# Patient Record
Sex: Female | Born: 1935 | Race: White | Hispanic: No | Marital: Married | Smoking: Never smoker
Health system: Southern US, Community
[De-identification: ages and names within clinical notes are randomized; demographics above are authoritative.]

## PROBLEM LIST (undated history)

## (undated) DIAGNOSIS — R001 Bradycardia, unspecified: Secondary | ICD-10-CM

## (undated) HISTORY — PX: NEPHRECTOMY: SHX65

## (undated) HISTORY — PX: PACEMAKER INSERTION: SHX728

## (undated) HISTORY — PX: CARPAL TUNNEL RELEASE: SHX101

---

## 2008-06-06 ENCOUNTER — Encounter: Admission: RE | Admit: 2008-06-06 | Discharge: 2008-06-06 | Payer: Self-pay | Admitting: Neurosurgery

## 2010-02-05 ENCOUNTER — Encounter: Admission: RE | Admit: 2010-02-05 | Discharge: 2010-02-05 | Payer: Self-pay | Admitting: Neurosurgery

## 2010-09-24 ENCOUNTER — Other Ambulatory Visit: Payer: Self-pay | Admitting: Neurosurgery

## 2010-09-24 DIAGNOSIS — M47816 Spondylosis without myelopathy or radiculopathy, lumbar region: Secondary | ICD-10-CM

## 2010-10-02 ENCOUNTER — Other Ambulatory Visit: Payer: Self-pay

## 2010-10-15 ENCOUNTER — Ambulatory Visit
Admission: RE | Admit: 2010-10-15 | Discharge: 2010-10-15 | Disposition: A | Discharge status: Home / Self Care | Payer: Medicare Other | Source: Ambulatory Visit | Attending: Neurosurgery | Admitting: Neurosurgery

## 2010-10-15 DIAGNOSIS — M47816 Spondylosis without myelopathy or radiculopathy, lumbar region: Secondary | ICD-10-CM

## 2010-10-23 ENCOUNTER — Other Ambulatory Visit: Payer: Self-pay | Admitting: Neurosurgery

## 2010-10-23 DIAGNOSIS — M47816 Spondylosis without myelopathy or radiculopathy, lumbar region: Secondary | ICD-10-CM

## 2010-10-26 ENCOUNTER — Ambulatory Visit
Admission: RE | Admit: 2010-10-26 | Discharge: 2010-10-26 | Disposition: A | Discharge status: Home / Self Care | Payer: Medicare Other | Source: Ambulatory Visit | Attending: Neurosurgery | Admitting: Neurosurgery

## 2010-10-26 DIAGNOSIS — M47816 Spondylosis without myelopathy or radiculopathy, lumbar region: Secondary | ICD-10-CM

## 2010-10-26 MED ORDER — IOHEXOL 180 MG/ML  SOLN
1.0000 mL | Freq: Once | INTRAMUSCULAR | Status: AC | PRN
  Administered 2010-10-26: 1 mL via EPIDURAL

## 2010-10-26 MED ORDER — METHYLPREDNISOLONE ACETATE 40 MG/ML INJ SUSP (RADIOLOG
120.0000 mg | Freq: Once | INTRAMUSCULAR | Status: AC
  Administered 2010-10-26: 120 mg via EPIDURAL

## 2010-11-18 ENCOUNTER — Other Ambulatory Visit: Payer: Self-pay | Admitting: Neurosurgery

## 2010-11-18 DIAGNOSIS — M47816 Spondylosis without myelopathy or radiculopathy, lumbar region: Secondary | ICD-10-CM

## 2010-11-19 ENCOUNTER — Other Ambulatory Visit: Payer: Medicare Other

## 2010-11-27 ENCOUNTER — Ambulatory Visit
Admission: RE | Admit: 2010-11-27 | Discharge: 2010-11-27 | Disposition: A | Discharge status: Home / Self Care | Payer: Medicare Other | Source: Ambulatory Visit | Attending: Neurosurgery | Admitting: Neurosurgery

## 2010-11-27 DIAGNOSIS — M47816 Spondylosis without myelopathy or radiculopathy, lumbar region: Secondary | ICD-10-CM

## 2010-11-27 MED ORDER — METHYLPREDNISOLONE ACETATE 40 MG/ML INJ SUSP (RADIOLOG
120.0000 mg | Freq: Once | INTRAMUSCULAR | Status: AC
  Administered 2010-11-27: 120 mg via EPIDURAL

## 2010-11-27 MED ORDER — IOHEXOL 180 MG/ML  SOLN
1.0000 mL | Freq: Once | INTRAMUSCULAR | Status: AC | PRN
  Administered 2010-11-27: 1 mL via EPIDURAL

## 2015-06-29 ENCOUNTER — Encounter (HOSPITAL_BASED_OUTPATIENT_CLINIC_OR_DEPARTMENT_OTHER): Payer: Self-pay | Admitting: *Deleted

## 2015-06-29 ENCOUNTER — Emergency Department (HOSPITAL_BASED_OUTPATIENT_CLINIC_OR_DEPARTMENT_OTHER)
Admission: EM | Admit: 2015-06-29 | Discharge: 2015-06-29 | Disposition: A | Discharge status: Home / Self Care | Payer: Medicare HMO | Attending: Emergency Medicine | Admitting: Emergency Medicine

## 2015-06-29 DIAGNOSIS — Z79899 Other long term (current) drug therapy: Secondary | ICD-10-CM | POA: Insufficient documentation

## 2015-06-29 DIAGNOSIS — R011 Cardiac murmur, unspecified: Secondary | ICD-10-CM | POA: Insufficient documentation

## 2015-06-29 DIAGNOSIS — Z950 Presence of cardiac pacemaker: Secondary | ICD-10-CM | POA: Insufficient documentation

## 2015-06-29 DIAGNOSIS — K529 Noninfective gastroenteritis and colitis, unspecified: Secondary | ICD-10-CM

## 2015-06-29 DIAGNOSIS — R112 Nausea with vomiting, unspecified: Secondary | ICD-10-CM | POA: Diagnosis present

## 2015-06-29 HISTORY — DX: Bradycardia, unspecified: R00.1

## 2015-06-29 LAB — CBC WITH DIFFERENTIAL/PLATELET
BASOS ABS: 0 10*3/uL (ref 0.0–0.1)
BASOS PCT: 0 %
EOS ABS: 0 10*3/uL (ref 0.0–0.7)
EOS PCT: 0 %
HCT: 38.9 % (ref 36.0–46.0)
Hemoglobin: 13.1 g/dL (ref 12.0–15.0)
Lymphocytes Relative: 3 %
Lymphs Abs: 0.3 10*3/uL — ABNORMAL LOW (ref 0.7–4.0)
MCH: 31.4 pg (ref 26.0–34.0)
MCHC: 33.7 g/dL (ref 30.0–36.0)
MCV: 93.3 fL (ref 78.0–100.0)
MONO ABS: 0.4 10*3/uL (ref 0.1–1.0)
MONOS PCT: 3 %
Neutro Abs: 11.9 10*3/uL — ABNORMAL HIGH (ref 1.7–7.7)
Neutrophils Relative %: 94 %
PLATELETS: 178 10*3/uL (ref 150–400)
RBC: 4.17 MIL/uL (ref 3.87–5.11)
RDW: 12.2 % (ref 11.5–15.5)
WBC: 12.6 10*3/uL — ABNORMAL HIGH (ref 4.0–10.5)

## 2015-06-29 LAB — COMPREHENSIVE METABOLIC PANEL
ALBUMIN: 3.7 g/dL (ref 3.5–5.0)
ALT: 19 U/L (ref 14–54)
AST: 19 U/L (ref 15–41)
Alkaline Phosphatase: 52 U/L (ref 38–126)
Anion gap: 8 (ref 5–15)
BUN: 23 mg/dL — AB (ref 6–20)
CALCIUM: 9.1 mg/dL (ref 8.9–10.3)
CHLORIDE: 102 mmol/L (ref 101–111)
CO2: 24 mmol/L (ref 22–32)
Creatinine, Ser: 0.65 mg/dL (ref 0.44–1.00)
GFR calc Af Amer: >60 mL/min (ref >60)
GLUCOSE: 108 mg/dL — AB (ref 65–99)
Potassium: 4.4 mmol/L (ref 3.5–5.1)
SODIUM: 134 mmol/L — AB (ref 135–145)
TOTAL PROTEIN: 7 g/dL (ref 6.5–8.1)
Total Bilirubin: 0.8 mg/dL (ref 0.3–1.2)

## 2015-06-29 LAB — TROPONIN I

## 2015-06-29 LAB — URINALYSIS, ROUTINE W REFLEX MICROSCOPIC
Glucose, UA: NEGATIVE mg/dL
KETONES UR: 15 mg/dL — AB
Nitrite: NEGATIVE
PROTEIN: NEGATIVE mg/dL
SPECIFIC GRAVITY, URINE: 1.025 (ref 1.005–1.030)
pH: 5.5 (ref 5.0–8.0)

## 2015-06-29 LAB — LIPASE, BLOOD: LIPASE: 23 U/L (ref 11–51)

## 2015-06-29 LAB — URINE MICROSCOPIC-ADD ON

## 2015-06-29 MED ORDER — SODIUM CHLORIDE 0.9 % IV BOLUS (SEPSIS)
500.0000 mL | Freq: Once | INTRAVENOUS | Status: AC
  Administered 2015-06-29: 500 mL via INTRAVENOUS

## 2015-06-29 MED ORDER — FAMOTIDINE IN NACL 20-0.9 MG/50ML-% IV SOLN
20.0000 mg | Freq: Once | INTRAVENOUS | Status: AC
  Administered 2015-06-29: 20 mg via INTRAVENOUS
  Filled 2015-06-29: qty 50

## 2015-06-29 MED ORDER — FAMOTIDINE 20 MG PO TABS: 20.0000 mg | ORAL_TABLET | Freq: Two times a day (BID) | ORAL | Status: AC

## 2015-06-29 MED ORDER — SODIUM CHLORIDE 0.9 % IV SOLN
INTRAVENOUS | Status: DC
  Administered 2015-06-29: 18:00:00 via INTRAVENOUS

## 2015-06-29 MED ORDER — ONDANSETRON 4 MG PO TBDP: 4.0000 mg | ORAL_TABLET | ORAL | Status: AC | PRN

## 2015-06-29 MED ORDER — ONDANSETRON HCL 4 MG/2ML IJ SOLN
4.0000 mg | Freq: Once | INTRAMUSCULAR | Status: AC
  Administered 2015-06-29: 4 mg via INTRAVENOUS
  Filled 2015-06-29: qty 2

## 2015-06-29 NOTE — ED Provider Notes (Signed)
CSN: [id number]     Arrival date & time 06/29/15  1511 History  By signing my name below, I, [facility] Washington, attest that this documentation has been prepared under the direction and in the presence of Arby BarretteMarcy Quinlin Conant, MD. Electronically Signed: Randell PatientMarrissa Washington, ED Scribe. 06/29/2015. 7:39 PM.   Chief Complaint  Patient presents with  . Emesis   The history is provided by the patient. No language interpreter was used.   HPI Comments: Darnelle SpangleLeah S Fisch is a 82 y.o. female who presents to the Emergency Department complaining of intermittent, moderate emesis onset last night. Patient reports she was asymptomatic upon going to bed but woke from sleep 14 hours ago with nausea, followed by frequent emesis and diarrhea x4-5. She notes abdominal discomfort that she describes as feeling unsettled without pain. She has been eating less yesterday and today. She has not taken any medications for treatment . Per husband, she has been taking Tramadol for pain management after a right carpal tunnel release but has been eating less food with this medication. She reports that she ate barbecue last night with her husband who is asymptomatic. Denies abdominal pain, SOB, CP, difficulty urinating, dysuria.  Past Medical History  Diagnosis Date  . Bradycardia    Past Surgical History  Procedure Laterality Date  . Pacemaker insertion    . Carpal tunnel release Right    History reviewed. No pertinent family history. Social History  Substance Use Topics  . Smoking status: Never Smoker   . Smokeless tobacco: None  . Alcohol Use: No   OB History    No data available     Review of Systems A complete 10 system review of systems was obtained and all systems are negative except as noted in the HPI and PMH.   Allergies  Codeine and Tetracyclines & related  Home Medications   Prior to Admission medications   Medication Sig Start Date End Date Taking? Authorizing Provider  ALPRAZolam Prudy Feeler(XANAX) 0.25 MG tablet  Take 0.25 mg by mouth at bedtime as needed for anxiety.   Yes Historical Provider, MD  carvedilol (COREG) 3.125 MG tablet Take 3.125 mg by mouth 2 (two) times daily with a meal.   Yes Historical Provider, MD  hydroxychloroquine (PLAQUENIL) 200 MG tablet Take by mouth daily.   Yes Historical Provider, MD  lisinopril (PRINIVIL,ZESTRIL) 2.5 MG tablet Take 2.5 mg by mouth daily.   Yes Historical Provider, MD  omeprazole (PRILOSEC) 40 MG capsule Take 40 mg by mouth daily.   Yes Historical Provider, MD  pravastatin (PRAVACHOL) 40 MG tablet Take 40 mg by mouth daily.   Yes Historical Provider, MD  spironolactone (ALDACTONE) 25 MG tablet Take 25 mg by mouth daily.   Yes Historical Provider, MD  traMADol (ULTRAM) 50 MG tablet Take by mouth every 6 (six) hours as needed.   Yes Historical Provider, MD  famotidine (PEPCID) 20 MG tablet Take 1 tablet (20 mg total) by mouth 2 (two) times daily. 06/29/15   Arby BarretteMarcy Luverta Korte, MD  ondansetron (ZOFRAN ODT) 4 MG disintegrating tablet Take 1 tablet (4 mg total) by mouth every 4 (four) hours as needed for nausea or vomiting. 06/29/15   Arby BarretteMarcy Enijah Furr, MD   BP 126/74 mmHg  Pulse 77  Temp(Src) 98.7 F (37.1 C) (Oral)  Resp 18  Ht 5\' 1"  (1.549 m)  Wt 130 lb (58.968 kg)  BMI 24.58 kg/m2  SpO2 100% Physical Exam  Constitutional: She is oriented to person, place, and time. She appears well-developed and  well-nourished. No distress.  HENT:  Head: Normocephalic and atraumatic.  Mouth/Throat: Mucous membranes are normal. Mucous membranes are not pale.  Mucus membranes are pink and moist.  Eyes: Conjunctivae and EOM are normal.  Cardiovascular: Normal rate.   Murmur heard. 2/6 systolic injection murmur.  Pulmonary/Chest: Effort normal and breath sounds normal. No respiratory distress. She has no wheezes. She has no rhonchi. She has no rales.  Abdominal: Soft. She exhibits no distension. There is no tenderness. There is no rebound and no guarding.  Abdomen soft and  nontender.  Musculoskeletal: Normal range of motion. She exhibits no edema or tenderness.  No peripheral edema.  Neurological: She is alert and oriented to person, place, and time.  Skin: Skin is warm and dry.  Well-healing incision right volnar wrist without erythema or swelling.  Psychiatric: She has a normal mood and affect. Her behavior is normal.  Nursing note and vitals reviewed.   ED Course  Procedures   DIAGNOSTIC STUDIES: Oxygen Saturation is 99% on RA, normal by my interpretation.    COORDINATION OF CARE: 4:35 PM Will order IV fluids, labs, and EKG. Discussed treatment plan with pt at bedside and pt agreed to plan.   Labs Review Labs Reviewed  COMPREHENSIVE METABOLIC PANEL - Abnormal; Notable for the following:    Sodium 134 (*)    Glucose, Bld 108 (*)    BUN 23 (*)    All other components within normal limits  CBC WITH DIFFERENTIAL/PLATELET - Abnormal; Notable for the following:    WBC 12.6 (*)    Neutro Abs 11.9 (*)    Lymphs Abs 0.3 (*)    All other components within normal limits  URINALYSIS, ROUTINE W REFLEX MICROSCOPIC (NOT AT [facility]) - Abnormal; Notable for the following:    Hgb urine dipstick TRACE (*)    Bilirubin Urine SMALL (*)    Ketones, ur 15 (*)    Leukocytes, UA TRACE (*)    All other components within normal limits  URINE MICROSCOPIC-ADD ON - Abnormal; Notable for the following:    Squamous Epithelial / LPF 0-5 (*)    Bacteria, UA MANY (*)    All other components within normal limits  URINE CULTURE  LIPASE, BLOOD  TROPONIN I    I have personally reviewed and evaluated these lab results as part of my medical decision-making.   EKG Interpretation   Date/Time:  Sunday June 29 2015 17:16:02 EDT Ventricular Rate:  80 PR Interval:  212 QRS Duration: 166 QT Interval:  439 QTC Calculation: 506 R Axis:   -62 Text Interpretation:  Sinus rhythm Borderline prolonged PR interval Left  bundle branch block agree. no old comparison. Confirmed by  Donnald Garre, MD,  Lebron Conners [phone]) on 06/29/2015 5:25:00 PM      MDM   Final diagnoses:  Gastroenteritis   Patient has had both vomiting and diarrhea of acute onset. She has no associated abdominal pain or fever. She ate some barbecue yesterday evening that she thinks did not agree with her. Her husband however ate the same thing is not having similar symptoms. At this time I most suspect viral gastroenteritis. Patient's labs and vital signs are stable. She does not show signs of hypovolemia, tachycardia or significant renal insufficiency. She has good family support. She and family feel comfortable trying anti-emetics and hydration with rest at home. They're counseled on necessity to return should she develop signs of increasing weakness, continued vomiting, pain or fever.   Arby Barrette, MD 06/29/15 [phone]

## 2015-06-29 NOTE — ED Notes (Signed)
Pt reports N/V/D through the night.  Denies pain.  Denies anyone else being sick.

## 2015-06-29 NOTE — Discharge Instructions (Signed)
Suspected Viral Gastroenteritis °Viral gastroenteritis is also known as stomach flu. This condition affects the stomach and intestinal tract. It can cause sudden diarrhea and vomiting. The illness typically lasts 3 to 8 days. Most people develop an immune response that eventually gets rid of the virus. While this natural response develops, the virus can make you quite ill. °CAUSES  °Many different viruses can cause gastroenteritis, such as rotavirus or noroviruses. You can catch one of these viruses by consuming contaminated food or water. You may also catch a virus by sharing utensils or other personal items with an infected person or by touching a contaminated surface. °SYMPTOMS  °The most common symptoms are diarrhea and vomiting. These problems can cause a severe loss of body fluids (dehydration) and a body salt (electrolyte) imbalance. Other symptoms may include: °· Fever. °· Headache. °· Fatigue. °· Abdominal pain. °DIAGNOSIS  °Your caregiver can usually diagnose viral gastroenteritis based on your symptoms and a physical exam. A stool sample may also be taken to test for the presence of viruses or other infections. °TREATMENT  °This illness typically goes away on its own. Treatments are aimed at rehydration. The most serious cases of viral gastroenteritis involve vomiting so severely that you are not able to keep fluids down. In these cases, fluids must be given through an intravenous line (IV). °HOME CARE INSTRUCTIONS  °· Drink enough fluids to keep your urine clear or pale yellow. Drink small amounts of fluids frequently and increase the amounts as tolerated. °· Ask your caregiver for specific rehydration instructions. °· Avoid: °¨ Foods high in sugar. °¨ Alcohol. °¨ Carbonated drinks. °¨ Tobacco. °¨ Juice. °¨ Caffeine drinks. °¨ Extremely hot or cold fluids. °¨ Fatty, greasy foods. °¨ Too much intake of anything at one time. °¨ Dairy products until 24 to 48 hours after diarrhea stops. °· You may consume  probiotics. Probiotics are active cultures of beneficial bacteria. They may lessen the amount and number of diarrheal stools in adults. Probiotics can be found in yogurt with active cultures and in supplements. °· Wash your hands well to avoid spreading the virus. °· Only take over-the-counter or prescription medicines for pain, discomfort, or fever as directed by your caregiver. Do not give aspirin to children. Antidiarrheal medicines are not recommended. °· Ask your caregiver if you should continue to take your regular prescribed and over-the-counter medicines. °· Keep all follow-up appointments as directed by your caregiver. °SEEK IMMEDIATE MEDICAL CARE IF:  °· You are unable to keep fluids down. °· You do not urinate at least once every 6 to 8 hours. °· You develop shortness of breath. °· You notice blood in your stool or vomit. This may look like coffee grounds. °· You have abdominal pain that increases or is concentrated in one small area (localized). °· You have persistent vomiting or diarrhea. °· You have a fever. °· The patient is a child younger than 3 months, and he or she has a fever. °· The patient is a child older than 3 months, and he or she has a fever and persistent symptoms. °· The patient is a child older than 3 months, and he or she has a fever and symptoms suddenly get worse. °· The patient is a baby, and he or she has no tears when crying. °MAKE SURE YOU:  °· Understand these instructions. °· Will watch your condition. °· Will get help right away if you are not doing well or get worse. °  °This information is not intended to   replace advice given to you by your health care provider. Make sure you discuss any questions you have with your health care provider. °  °Document Released: 03/08/2005 Document Revised: 05/31/2011 Document Reviewed: 12/23/2010 °Elsevier Interactive Patient Education ©2016 [employer] ° °

## 2015-07-01 LAB — URINE CULTURE

## 2018-11-15 ENCOUNTER — Encounter (HOSPITAL_BASED_OUTPATIENT_CLINIC_OR_DEPARTMENT_OTHER): Payer: Self-pay

## 2018-11-15 ENCOUNTER — Emergency Department (HOSPITAL_BASED_OUTPATIENT_CLINIC_OR_DEPARTMENT_OTHER)
Admission: EM | Admit: 2018-11-15 | Discharge: 2018-11-15 | Disposition: A | Discharge status: Home / Self Care | Payer: Medicare HMO | Attending: Emergency Medicine | Admitting: Emergency Medicine

## 2018-11-15 ENCOUNTER — Other Ambulatory Visit: Payer: Self-pay

## 2018-11-15 DIAGNOSIS — Z881 Allergy status to other antibiotic agents status: Secondary | ICD-10-CM | POA: Insufficient documentation

## 2018-11-15 DIAGNOSIS — L509 Urticaria, unspecified: Secondary | ICD-10-CM | POA: Diagnosis not present

## 2018-11-15 DIAGNOSIS — Z79899 Other long term (current) drug therapy: Secondary | ICD-10-CM | POA: Insufficient documentation

## 2018-11-15 DIAGNOSIS — R21 Rash and other nonspecific skin eruption: Secondary | ICD-10-CM | POA: Diagnosis present

## 2018-11-15 DIAGNOSIS — Z885 Allergy status to narcotic agent status: Secondary | ICD-10-CM | POA: Diagnosis not present

## 2018-11-15 LAB — CBC WITH DIFFERENTIAL/PLATELET
Abs Immature Granulocytes: 0.03 10*3/uL (ref 0.00–0.07)
Basophils Absolute: 0 10*3/uL (ref 0.0–0.1)
Basophils Relative: 0 %
Eosinophils Absolute: 0.1 10*3/uL (ref 0.0–0.5)
Eosinophils Relative: 1 %
HCT: 41.6 % (ref 36.0–46.0)
Hemoglobin: 13.5 g/dL (ref 12.0–15.0)
Immature Granulocytes: 0 %
Lymphocytes Relative: 14 %
Lymphs Abs: 1.7 10*3/uL (ref 0.7–4.0)
MCH: 31.7 pg (ref 26.0–34.0)
MCHC: 32.5 g/dL (ref 30.0–36.0)
MCV: 97.7 fL (ref 80.0–100.0)
Monocytes Absolute: 0.6 10*3/uL (ref 0.1–1.0)
Monocytes Relative: 4 %
Neutro Abs: 10.5 10*3/uL — ABNORMAL HIGH (ref 1.7–7.7)
Neutrophils Relative %: 81 %
Platelets: 236 10*3/uL (ref 150–400)
RBC: 4.26 MIL/uL (ref 3.87–5.11)
RDW: 12.2 % (ref 11.5–15.5)
WBC: 12.9 10*3/uL — ABNORMAL HIGH (ref 4.0–10.5)
nRBC: 0 % (ref 0.0–0.2)

## 2018-11-15 LAB — COMPREHENSIVE METABOLIC PANEL
ALT: 19 U/L (ref 0–44)
AST: 21 U/L (ref 15–41)
Albumin: 4.2 g/dL (ref 3.5–5.0)
Alkaline Phosphatase: 39 U/L (ref 38–126)
Anion gap: 10 (ref 5–15)
BUN: 25 mg/dL — ABNORMAL HIGH (ref 8–23)
CO2: 21 mmol/L — ABNORMAL LOW (ref 22–32)
Calcium: 9.6 mg/dL (ref 8.9–10.3)
Chloride: 104 mmol/L (ref 98–111)
Creatinine, Ser: 0.73 mg/dL (ref 0.44–1.00)
GFR calc Af Amer: >60 mL/min (ref >60)
GFR calc non Af Amer: >60 mL/min (ref >60)
Glucose, Bld: 112 mg/dL — ABNORMAL HIGH (ref 70–99)
Potassium: 4.3 mmol/L (ref 3.5–5.1)
Sodium: 135 mmol/L (ref 135–145)
Total Bilirubin: 1 mg/dL (ref 0.3–1.2)
Total Protein: 7.2 g/dL (ref 6.5–8.1)

## 2018-11-15 MED ORDER — FAMOTIDINE IN NACL 20-0.9 MG/50ML-% IV SOLN
20.0000 mg | Freq: Once | INTRAVENOUS | Status: AC
  Administered 2018-11-15: 23:00:00 20 mg via INTRAVENOUS
  Filled 2018-11-15: qty 50

## 2018-11-15 MED ORDER — HYDROXYZINE HCL 25 MG PO TABS: 25.0000 mg | ORAL_TABLET | Freq: Four times a day (QID) | ORAL | 0 refills | Status: AC

## 2018-11-15 MED ORDER — DIPHENHYDRAMINE HCL 50 MG/ML IJ SOLN
25.0000 mg | Freq: Once | INTRAMUSCULAR | Status: AC
  Administered 2018-11-15: 23:00:00 25 mg via INTRAVENOUS
  Filled 2018-11-15: qty 1

## 2018-11-15 MED ORDER — DIPHENHYDRAMINE HCL 50 MG/ML IJ SOLN
25.0000 mg | Freq: Once | INTRAMUSCULAR | Status: AC
  Administered 2018-11-15: 25 mg via INTRAVENOUS
  Filled 2018-11-15: qty 1

## 2018-11-15 NOTE — ED Notes (Signed)
ED Provider at bedside. 

## 2018-11-15 NOTE — ED Provider Notes (Signed)
[facility] EMERGENCY DEPARTMENT Provider Note   CSN: [id number] Arrival date & time: 11/15/18  2119     History   Chief Complaint Chief Complaint  Patient presents with  . Allergic Reaction    HPI Misty Sims is a 85 y.o. female.     85 yo F with a chief complaint of an itchy rash.  Going on for the past couple days.  Is seen her family doctor twice today for the same thing.  Was given intramuscular Benadryl and Solu-Medrol.  She feels that the rash is changed to different spots.  Is very itchy.  She denies shortness of breath denies throat swelling denies vomiting diarrhea or syncope.  She is unsure what she may have been allergic to.  She has had some upper lip swelling and the rash is moved from peripheral to more centralized areas.  Rash is worse to her hands.  The history is provided by the patient and a relative.  Allergic Reaction Presenting symptoms: itching and rash   Presenting symptoms: no difficulty breathing, no difficulty swallowing and no wheezing   Itching:    Location:  Full body   Severity:  Moderate   Onset quality:  Gradual   Duration:  2 days   Timing:  Constant   Progression:  Worsening Severity:  Moderate Duration:  2 days Prior allergic episodes:  No prior episodes Relieved by:  Nothing Worsened by:  Nothing Ineffective treatments:  None tried   Past Medical History:  Diagnosis Date  . Bradycardia     There are no active problems to display for this patient.   Past Surgical History:  Procedure Laterality Date  . CARPAL TUNNEL RELEASE Right   . NEPHRECTOMY Left   . PACEMAKER INSERTION       OB History   No obstetric history on file.      Home Medications    Prior to Admission medications   Medication Sig Start Date End Date Taking? Authorizing Provider  ALPRAZolam Prudy Feeler(XANAX) 0.25 MG tablet Take 0.25 mg by mouth at bedtime as needed for anxiety.    [provider]  carvedilol (COREG) 3.125 MG tablet Take 3.125  mg by mouth 2 (two) times daily with a meal.    [provider]  famotidine (PEPCID) 20 MG tablet Take 1 tablet (20 mg total) by mouth 2 (two) times daily. 06/29/15   Arby BarrettePfeiffer, Marcy, MD  hydroxychloroquine (PLAQUENIL) 200 MG tablet Take by mouth daily.    [provider]  hydrOXYzine (ATARAX/VISTARIL) 25 MG tablet Take 1 tablet (25 mg total) by mouth every 6 (six) hours. 11/15/18   Melene PlanFloyd, Azreal Stthomas, DO  lisinopril (PRINIVIL,ZESTRIL) 2.5 MG tablet Take 2.5 mg by mouth daily.    [provider]  omeprazole (PRILOSEC) 40 MG capsule Take 40 mg by mouth daily.    [provider]  ondansetron (ZOFRAN ODT) 4 MG disintegrating tablet Take 1 tablet (4 mg total) by mouth every 4 (four) hours as needed for nausea or vomiting. 06/29/15   Arby BarrettePfeiffer, Marcy, MD  pravastatin (PRAVACHOL) 40 MG tablet Take 40 mg by mouth daily.    [provider]  spironolactone (ALDACTONE) 25 MG tablet Take 25 mg by mouth daily.    [provider]  traMADol (ULTRAM) 50 MG tablet Take by mouth every 6 (six) hours as needed.    [provider]    Family History No family history on file.  Social History Social History   Tobacco Use  .  Smoking status: Never Smoker  Substance Use Topics  . Alcohol use: No  . Drug use: No     Allergies   Codeine and Tetracyclines & related   Review of Systems Review of Systems  Constitutional: Negative for chills and fever.  HENT: Negative for congestion, rhinorrhea and trouble swallowing.   Eyes: Negative for redness and visual disturbance.  Respiratory: Negative for shortness of breath and wheezing.   Cardiovascular: Negative for chest pain and palpitations.  Gastrointestinal: Negative for nausea and vomiting.  Genitourinary: Negative for dysuria and urgency.  Musculoskeletal: Negative for arthralgias and myalgias.  Skin: Positive for itching and rash. Negative for pallor and wound.  Neurological: Negative for dizziness and  headaches.     Physical Exam Updated Vital Signs BP (!) 146/88   Pulse 81   Temp 98.3 F (36.8 C) (Oral)   Resp 17   Ht 5\' 1"  (1.549 m)   Wt 61.7 kg   SpO2 99%   BMI 25.70 kg/m   Physical Exam Vitals signs and nursing note reviewed.  Constitutional:      General: She is not in acute distress.    Appearance: She is well-developed. She is not diaphoretic.  HENT:     Head: Normocephalic and atraumatic.     Comments: Edema to the upper lip.  No tongue swelling.  Tolerating secretions no difficulty. Eyes:     Pupils: Pupils are equal, round, and reactive to light.  Neck:     Musculoskeletal: Normal range of motion and neck supple.  Cardiovascular:     Rate and Rhythm: Normal rate and regular rhythm.     Heart sounds: No murmur. No friction rub. No gallop.   Pulmonary:     Effort: Pulmonary effort is normal.     Breath sounds: No wheezing or rales.  Abdominal:     General: There is no distension.     Palpations: Abdomen is soft.     Tenderness: There is no abdominal tenderness.  Musculoskeletal:        General: No tenderness.  Skin:    General: Skin is warm and dry.     Comments: Diffuse areas of raised rash that is blanching.  Pruritic.  Edema to the upper lip.  Neurological:     Mental Status: She is alert and oriented to person, place, and time.  Psychiatric:        Behavior: Behavior normal.      ED Treatments / Results  Labs (all labs ordered are listed, but only abnormal results are displayed) Labs Reviewed  CBC WITH DIFFERENTIAL/PLATELET - Abnormal; Notable for the following components:      Result Value   WBC 12.9 (*)    Neutro Abs 10.5 (*)    All other components within normal limits  COMPREHENSIVE METABOLIC PANEL - Abnormal; Notable for the following components:   CO2 21 (*)    Glucose, Bld 112 (*)    BUN 25 (*)    All other components within normal limits    EKG None  Radiology No results found.  Procedures Procedures (including critical  care time)  Medications Ordered in ED Medications  diphenhydrAMINE (BENADRYL) injection 25 mg (25 mg Intravenous Given 11/15/18 2236)  famotidine (PEPCID) IVPB 20 mg premix (0 mg Intravenous Stopped 11/15/18 2321)  diphenhydrAMINE (BENADRYL) injection 25 mg (25 mg Intravenous Given 11/15/18 2325)     Initial Impression / Assessment and Plan / ED Course  I have reviewed the triage vital signs and the  nursing notes.  Pertinent labs & imaging results that were available during my care of the patient were reviewed by me and considered in my medical decision making (see chart for details).        85 yo F with a chief complaint of an itchy rash.  Most likely this is consistent with hives with areas of raised erythematous rash that seem to come and go.  She is concerned about how itchy it is and how she has had no relief.  I discussed with her the limitations of current therapy especially since she is already received intramuscular steroids.  She was told by her family doctor that she is come to the emerge department for IV medications.  I discussed the limitation of such.  I did offer to admit her to the hospital for observation.  She is currently declining and would like IV administration of Benadryl as well as Pepcid.  Will observe in the ED for a short time.  Will check basic lab work.  Lab work is returned and is largely unremarkable.  She is a very mild leukocytosis which could be due to her intramuscular steroid injection earlier today.  LFTs are unremarkable.  Total bilirubin is normal.  She is feeling better after IV Benadryl.  Discharge home on Benadryl and Pepcid or Zantac.  PCP follow-up.  11:35 PM:  I have discussed the diagnosis/risks/treatment options with the patient and family and believe the pt to be eligible for discharge home to follow-up with PCP. We also discussed returning to the ED immediately if new or worsening sx occur. We discussed the sx which are most concerning (e.g., sob,  syncope, diarrhea/v/n) that necessitate immediate return. Medications administered to the patient during their visit and any new prescriptions provided to the patient are listed below.  Medications given during this visit Medications  diphenhydrAMINE (BENADRYL) injection 25 mg (25 mg Intravenous Given 11/15/18 2236)  famotidine (PEPCID) IVPB 20 mg premix (0 mg Intravenous Stopped 11/15/18 2321)  diphenhydrAMINE (BENADRYL) injection 25 mg (25 mg Intravenous Given 11/15/18 2325)     The patient appears reasonably screen and/or stabilized for discharge and I doubt any other medical condition or other [facility] requiring further screening, evaluation, or treatment in the ED at this time prior to discharge.    Final Clinical Impressions(s) / ED Diagnoses   Final diagnoses:  Hives    ED Discharge Orders         Ordered    hydrOXYzine (ATARAX/VISTARIL) 25 MG tablet  Every 6 hours     11/15/18 2319           Melene Plan, DO 11/15/18 2335

## 2018-11-15 NOTE — ED Triage Notes (Signed)
Pt reports hives/redness/itching to limbs and torso since yesterday. Pt also has swelling to top lip. Pt was seen at PCP and received a shot of cortisone this AM.

## 2018-11-15 NOTE — Discharge Instructions (Signed)
Please return for shortness of breath nausea vomiting or feeling like you may pass out.  Please follow-up with your family doctor.
